# Patient Record
Sex: Male | Born: 1995 | Race: White | Hispanic: No | Marital: Married | Smoking: Never smoker
Health system: Southern US, Community
[De-identification: ages and names within clinical notes are randomized; demographics above are authoritative.]

---
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
## 2016-10-21 DIAGNOSIS — R112 Nausea with vomiting, unspecified: Secondary | ICD-10-CM | POA: Insufficient documentation

## 2016-10-21 DIAGNOSIS — E860 Dehydration: Secondary | ICD-10-CM | POA: Insufficient documentation

## 2016-10-21 DIAGNOSIS — R1084 Generalized abdominal pain: Secondary | ICD-10-CM | POA: Insufficient documentation

## 2016-10-22 ENCOUNTER — Encounter (HOSPITAL_COMMUNITY): Payer: Self-pay

## 2016-10-22 ENCOUNTER — Emergency Department (HOSPITAL_COMMUNITY)
Admission: EM | Admit: 2016-10-22 | Discharge: 2016-10-22 | Disposition: A | Discharge status: Home / Self Care | Payer: Medicaid Other | Attending: Emergency Medicine | Admitting: Emergency Medicine

## 2016-10-22 DIAGNOSIS — R112 Nausea with vomiting, unspecified: Secondary | ICD-10-CM

## 2016-10-22 DIAGNOSIS — R197 Diarrhea, unspecified: Secondary | ICD-10-CM

## 2016-10-22 DIAGNOSIS — E860 Dehydration: Secondary | ICD-10-CM

## 2016-10-22 LAB — URINALYSIS, ROUTINE W REFLEX MICROSCOPIC
BILIRUBIN URINE: NEGATIVE
GLUCOSE, UA: NEGATIVE mg/dL
HGB URINE DIPSTICK: NEGATIVE
Ketones, ur: 20 mg/dL — AB
LEUKOCYTES UA: NEGATIVE
NITRITE: NEGATIVE
PROTEIN: NEGATIVE mg/dL
SPECIFIC GRAVITY, URINE: 1.031 — AB (ref 1.005–1.030)
pH: 5 (ref 5.0–8.0)

## 2016-10-22 LAB — COMPREHENSIVE METABOLIC PANEL
ALK PHOS: 75 U/L (ref 38–126)
ALT: 35 U/L (ref 17–63)
ANION GAP: 12 (ref 5–15)
AST: 24 U/L (ref 15–41)
Albumin: 4.5 g/dL (ref 3.5–5.0)
BUN: 22 mg/dL — ABNORMAL HIGH (ref 6–20)
CALCIUM: 9.4 mg/dL (ref 8.9–10.3)
CO2: 23 mmol/L (ref 22–32)
Chloride: 100 mmol/L — ABNORMAL LOW (ref 101–111)
Creatinine, Ser: 1.04 mg/dL (ref 0.61–1.24)
Glucose, Bld: 126 mg/dL — ABNORMAL HIGH (ref 65–99)
Potassium: 3.6 mmol/L (ref 3.5–5.1)
Sodium: 135 mmol/L (ref 135–145)
Total Bilirubin: 1.5 mg/dL — ABNORMAL HIGH (ref 0.3–1.2)
Total Protein: 8 g/dL (ref 6.5–8.1)

## 2016-10-22 LAB — CBC
HEMATOCRIT: 49 % (ref 39.0–52.0)
HEMOGLOBIN: 17.3 g/dL — AB (ref 13.0–17.0)
MCH: 31.4 pg (ref 26.0–34.0)
MCHC: 35.3 g/dL (ref 30.0–36.0)
MCV: 88.9 fL (ref 78.0–100.0)
Platelets: 312 10*3/uL (ref 150–400)
RBC: 5.51 MIL/uL (ref 4.22–5.81)
RDW: 12 % (ref 11.5–15.5)
WBC: 12.4 10*3/uL — AB (ref 4.0–10.5)

## 2016-10-22 LAB — LIPASE, BLOOD: LIPASE: 19 U/L (ref 11–51)

## 2016-10-22 MED ORDER — SODIUM CHLORIDE 0.9 % IV BOLUS (SEPSIS)
1000.0000 mL | Freq: Once | INTRAVENOUS | Status: AC
  Administered 2016-10-22: 1000 mL via INTRAVENOUS

## 2016-10-22 MED ORDER — ONDANSETRON HCL 4 MG/2ML IJ SOLN
4.0000 mg | Freq: Once | INTRAMUSCULAR | Status: DC
  Filled 2016-10-22: qty 2

## 2016-10-22 MED ORDER — OXYCODONE-ACETAMINOPHEN 5-325 MG PO TABS
1.0000 | ORAL_TABLET | ORAL | Status: DC | PRN
  Filled 2016-10-22: qty 1

## 2016-10-22 MED ORDER — MAGNESIUM OXIDE 400 (241.3 MG) MG PO TABS
800.0000 mg | ORAL_TABLET | Freq: Once | ORAL | Status: AC
  Administered 2016-10-22: 800 mg via ORAL
  Filled 2016-10-22: qty 2

## 2016-10-22 MED ORDER — IBUPROFEN 200 MG PO TABS
400.0000 mg | ORAL_TABLET | Freq: Once | ORAL | Status: AC | PRN
  Administered 2016-10-22: 400 mg via ORAL
  Filled 2016-10-22: qty 2

## 2016-10-22 MED ORDER — ONDANSETRON 4 MG PO TBDP
4.0000 mg | ORAL_TABLET | Freq: Once | ORAL | Status: AC | PRN
  Administered 2016-10-22: 4 mg via ORAL
  Filled 2016-10-22: qty 1

## 2016-10-22 MED ORDER — ONDANSETRON 8 MG PO TBDP: 8.0000 mg | ORAL_TABLET | Freq: Three times a day (TID) | ORAL | 0 refills | Status: DC | PRN

## 2016-10-22 NOTE — ED Notes (Signed)
1x call back for lab draw, no answer

## 2016-10-22 NOTE — ED Notes (Signed)
He tells me he feels much better. He ambulates to b.r. And back without difficulty.

## 2016-10-22 NOTE — ED Notes (Signed)
Lab needs a recollect on the CMP

## 2016-10-22 NOTE — ED Notes (Signed)
Tried to go talk with family/patient about wait but left before I could get to them to talk they had left.

## 2016-10-22 NOTE — Discharge Instructions (Signed)
Hydrate well. Soft and clear liquid diet for now, and advance slowly to normal food.  Please return to the ER if your symptoms worsen; you have increased pain, fevers, chills, inability to keep any medications down, confusion or if you have bloody stools or vomiting.

## 2016-10-22 NOTE — ED Provider Notes (Signed)
[facility]-EMERGENCY DEPT Provider Note   CSN: [id number] Arrival date & time: 10/21/16  2248     History   Chief Complaint No chief complaint on file.   HPI Robert Solis is a 21 y.o. male.  HPI  (S) Robert Solis is a 22 y.o. male with complaint of gastrointestinal symptoms of watery diarrhea, lower abdominal cramps, nausea, vomiting, dehydration for 1 day. No blood in stool. No sick contacts. Pt denies any suspicious po intake. No blood in the stools or emesis.   No past medical history on file.  There are no active problems to display for this patient.   History reviewed. No pertinent surgical history.     Home Medications    Prior to Admission medications   Medication Sig Start Date End Date Taking? Authorizing Provider  ondansetron (ZOFRAN ODT) 8 MG disintegrating tablet Take 1 tablet (8 mg total) by mouth every 8 (eight) hours as needed for nausea. 10/22/16   Derwood Kaplan, MD    Family History No family history on file.  Social History Social History  Substance Use Topics  . Smoking status: Never Smoker  . Smokeless tobacco: Not on file  . Alcohol use No     Allergies   Patient has no known allergies.   Review of Systems Review of Systems  Constitutional: Positive for activity change.  Gastrointestinal: Positive for abdominal pain, diarrhea, nausea and vomiting.  Musculoskeletal: Positive for myalgias.  Neurological: Positive for dizziness.     Physical Exam Updated Vital Signs BP 111/74 (BP Location: Left Arm)   Pulse 70   Temp 98.2 F (36.8 C) (Oral)   Resp 13   Ht 5\' 7"  (1.702 m)   Wt 96.6 kg (213 lb)   SpO2 97%   BMI 33.36 kg/m   Physical Exam  Constitutional: He is oriented to person, place, and time. He appears well-developed.  HENT:  Head: Normocephalic and atraumatic.  Dry mucus membrane  Eyes: Conjunctivae and EOM are normal. Pupils are equal, round, and reactive to light.  Neck: Normal range of motion. Neck supple.    Cardiovascular: Normal rate and regular rhythm.   Pulmonary/Chest: Effort normal and breath sounds normal.  Abdominal: Soft. Bowel sounds are normal. He exhibits no distension and no mass. There is no tenderness. There is no rebound and no guarding.  Musculoskeletal: He exhibits no deformity.  Neurological: He is alert and oriented to person, place, and time.  Skin: Skin is warm.  Nursing note and vitals reviewed.    ED Treatments / Results  Labs (all labs ordered are listed, but only abnormal results are displayed) Labs Reviewed  CBC - Abnormal; Notable for the following:       Result Value   WBC 12.4 (*)    Hemoglobin 17.3 (*)    All other components within normal limits  URINALYSIS, ROUTINE W REFLEX MICROSCOPIC - Abnormal; Notable for the following:    Specific Gravity, Urine 1.031 (*)    Ketones, ur 20 (*)    All other components within normal limits  COMPREHENSIVE METABOLIC PANEL - Abnormal; Notable for the following:    Chloride 100 (*)    Glucose, Bld 126 (*)    BUN 22 (*)    Total Bilirubin 1.5 (*)    All other components within normal limits  LIPASE, BLOOD    EKG  EKG Interpretation None       Radiology No results found.  Procedures Procedures (including critical care time)  Medications Ordered  in ED Medications  ondansetron (ZOFRAN) injection 4 mg (4 mg Intravenous Not Given 10/22/16 0713)  ondansetron (ZOFRAN-ODT) disintegrating tablet 4 mg (4 mg Oral Given 10/22/16 0054)  ibuprofen (ADVIL,MOTRIN) tablet 400 mg (400 mg Oral Given 10/22/16 0053)  sodium chloride 0.9 % bolus 1,000 mL (0 mLs Intravenous Stopped 10/22/16 0659)  magnesium oxide (MAG-OX) tablet 800 mg (800 mg Oral Given 10/22/16 0711)     Initial Impression / Assessment and Plan / ED Course  I have reviewed the triage vital signs and the nursing notes.  Pertinent labs & imaging results that were available during my care of the patient were reviewed by me and considered in my medical  decision making (see chart for details).  Clinical Course as of Oct 23 831  Sun Oct 22, 2016  [id number] Pt reassessed. Pt's VSS and WNL. Pt's cap refill < 3 seconds. Pt has been hydrated in the ER and now passed po challenge. We will discharge with antiemetic. Strict ER return precautions have been discussed and pt will return if he is unable to tolerate fluids and symptoms are getting worse.   [AN]    Clinical Course User Index [AN] Derwood KaplanNanavati, Caralyn Twining, MD    (A) Gastroenteritis or food poisoning likely leading to dehydration  (P) I have recommended small amounts clear fluids frequently, soups, juices, water and advance diet as tolerated. Return office visit if symptoms persist or worsen; I have alerted the patient to call if high fever, dehydration, marked weakness, fainting, increased abdominal pain, blood in stool or vomit.   Final Clinical Impressions(s) / ED Diagnoses   Final diagnoses:  Dehydration  Nausea vomiting and diarrhea    New Prescriptions New Prescriptions   ONDANSETRON (ZOFRAN ODT) 8 MG DISINTEGRATING TABLET    Take 1 tablet (8 mg total) by mouth every 8 (eight) hours as needed for nausea.     Derwood KaplanNanavati, Vincy Feliz, MD 10/22/16 [phone]

## 2016-10-22 NOTE — ED Notes (Signed)
Pt c/o left abd pain since 10am this morning. Pt has vomited 6-8 times today. Also c/o diarrhea for same amount of time. Pt also C/o of SOB for the past hour.

## 2018-03-01 ENCOUNTER — Other Ambulatory Visit: Payer: Self-pay

## 2018-03-01 ENCOUNTER — Emergency Department (HOSPITAL_COMMUNITY): Payer: Medicaid Other

## 2018-03-01 ENCOUNTER — Encounter (HOSPITAL_COMMUNITY): Payer: Self-pay

## 2018-03-01 ENCOUNTER — Emergency Department (HOSPITAL_COMMUNITY)
Admission: EM | Admit: 2018-03-01 | Discharge: 2018-03-01 | Disposition: A | Discharge status: Home / Self Care | Payer: Medicaid Other | Attending: Emergency Medicine | Admitting: Emergency Medicine

## 2018-03-01 DIAGNOSIS — N23 Unspecified renal colic: Secondary | ICD-10-CM

## 2018-03-01 LAB — CBC WITH DIFFERENTIAL/PLATELET
Basophils Absolute: 0 10*3/uL (ref 0.0–0.1)
Basophils Relative: 0 %
Eosinophils Absolute: 0.2 10*3/uL (ref 0.0–0.7)
Eosinophils Relative: 3 %
HEMATOCRIT: 44.6 % (ref 39.0–52.0)
HEMOGLOBIN: 15.4 g/dL (ref 13.0–17.0)
LYMPHS ABS: 1.9 10*3/uL (ref 0.7–4.0)
LYMPHS PCT: 26 %
MCH: 31.2 pg (ref 26.0–34.0)
MCHC: 34.5 g/dL (ref 30.0–36.0)
MCV: 90.3 fL (ref 78.0–100.0)
MONOS PCT: 14 %
Monocytes Absolute: 1 10*3/uL (ref 0.1–1.0)
NEUTROS ABS: 4.2 10*3/uL (ref 1.7–7.7)
NEUTROS PCT: 57 %
Platelets: 272 10*3/uL (ref 150–400)
RBC: 4.94 MIL/uL (ref 4.22–5.81)
RDW: 12.1 % (ref 11.5–15.5)
WBC: 7.3 10*3/uL (ref 4.0–10.5)

## 2018-03-01 LAB — BASIC METABOLIC PANEL
Anion gap: 10 (ref 5–15)
BUN: 21 mg/dL — AB (ref 6–20)
CO2: 26 mmol/L (ref 22–32)
Calcium: 9.6 mg/dL (ref 8.9–10.3)
Chloride: 104 mmol/L (ref 98–111)
Creatinine, Ser: 1.03 mg/dL (ref 0.61–1.24)
GFR calc non Af Amer: >60 mL/min (ref >60)
Glucose, Bld: 103 mg/dL — ABNORMAL HIGH (ref 70–99)
POTASSIUM: 3.3 mmol/L — AB (ref 3.5–5.1)
SODIUM: 140 mmol/L (ref 135–145)

## 2018-03-01 LAB — RAPID URINE DRUG SCREEN, HOSP PERFORMED
AMPHETAMINES: NOT DETECTED
Barbiturates: NOT DETECTED
Benzodiazepines: NOT DETECTED
Cocaine: NOT DETECTED
OPIATES: NOT DETECTED
Tetrahydrocannabinol: POSITIVE — AB

## 2018-03-01 LAB — URINALYSIS, ROUTINE W REFLEX MICROSCOPIC
Bacteria, UA: NONE SEEN
Bilirubin Urine: NEGATIVE
GLUCOSE, UA: NEGATIVE mg/dL
KETONES UR: NEGATIVE mg/dL
LEUKOCYTES UA: NEGATIVE
NITRITE: NEGATIVE
PROTEIN: NEGATIVE mg/dL
Specific Gravity, Urine: 1.028 (ref 1.005–1.030)
pH: 5 (ref 5.0–8.0)

## 2018-03-01 LAB — CK: CK TOTAL: 350 U/L (ref 49–397)

## 2018-03-01 MED ORDER — MORPHINE SULFATE (PF) 4 MG/ML IV SOLN
4.0000 mg | Freq: Once | INTRAVENOUS | Status: AC
  Administered 2018-03-01: 4 mg via INTRAVENOUS
  Filled 2018-03-01: qty 1

## 2018-03-01 MED ORDER — ONDANSETRON 4 MG PO TBDP: 4.0000 mg | ORAL_TABLET | Freq: Three times a day (TID) | ORAL | 0 refills | Status: AC | PRN

## 2018-03-01 MED ORDER — OXYCODONE-ACETAMINOPHEN 5-325 MG PO TABS
2.0000 | ORAL_TABLET | Freq: Once | ORAL | Status: AC
  Administered 2018-03-01: 2 via ORAL
  Filled 2018-03-01: qty 2

## 2018-03-01 MED ORDER — KETOROLAC TROMETHAMINE 30 MG/ML IJ SOLN
30.0000 mg | Freq: Once | INTRAMUSCULAR | Status: AC
  Administered 2018-03-01: 30 mg via INTRAVENOUS
  Filled 2018-03-01: qty 1

## 2018-03-01 MED ORDER — SODIUM CHLORIDE 0.9 % IV BOLUS
1000.0000 mL | Freq: Once | INTRAVENOUS | Status: AC
  Administered 2018-03-01: 1000 mL via INTRAVENOUS

## 2018-03-01 MED ORDER — OXYCODONE-ACETAMINOPHEN 5-325 MG PO TABS: 1.0000 | ORAL_TABLET | ORAL | 0 refills | Status: AC | PRN

## 2018-03-01 MED ORDER — ONDANSETRON HCL 4 MG/2ML IJ SOLN
4.0000 mg | Freq: Once | INTRAMUSCULAR | Status: AC
  Administered 2018-03-01: 4 mg via INTRAVENOUS
  Filled 2018-03-01: qty 2

## 2018-03-01 MED ORDER — IBUPROFEN 600 MG PO TABS: 600.0000 mg | ORAL_TABLET | Freq: Four times a day (QID) | ORAL | 0 refills | Status: AC | PRN

## 2018-03-01 NOTE — Discharge Instructions (Addendum)
Take the pain and nausea medication as prescribed.  Do not drive or operate heavy machinery when you are taking the pain medication.  Follow-up with the urologist.  Return to the ED if you develop worsening pain, vomiting, fever or any other concerns.

## 2018-03-01 NOTE — ED Triage Notes (Signed)
Pt reports left lower back pain onset tonight, states has vomited x 1 and not sure if it is related to the back pain.  Pt denies urinary symptoms.

## 2018-03-01 NOTE — ED Provider Notes (Signed)
[facility] EMERGENCY DEPARTMENT Provider Note   CSN: [id number] Arrival date & time: 03/01/18  0408     History   Chief Complaint Chief Complaint  Patient presents with  . Back Pain    HPI Robert Solis is a 22 y.o. male.  Patient awoke from sleep about 3 AM with left-sided flank and low back pain.  He felt well when he went to bed.  States he worked out in the [employer] yesterday but does state adequately hydrated.  He describes constant left-sided pain that is not improved with naproxen.  He has never had this kind of pain before.  No fall or injury.  No radiation of the pain down his leg.  Did have one episode of vomiting.  No pain with urination or blood in the urine.  No bowel or bladder incontinence.  No fever.  No history of previous back issues.  Denies any IV drug abuse or history of cancer.  He has never had a kidney stone and no previous abdominal surgeries.  The history is provided by the patient and the EMS personnel.  Back Pain   Pertinent negatives include no chest pain, no fever, no headaches, no dysuria and no weakness.    History reviewed. No pertinent past medical history.  There are no active problems to display for this patient.   History reviewed. No pertinent surgical history.      Home Medications    Prior to Admission medications   Medication Sig Start Date End Date Taking? Authorizing Provider  ondansetron (ZOFRAN ODT) 8 MG disintegrating tablet Take 1 tablet (8 mg total) by mouth every 8 (eight) hours as needed for nausea. 10/22/16   Derwood Kaplan, MD    Family History No family history on file.  Social History Social History   Tobacco Use  . Smoking status: Never Smoker  . Smokeless tobacco: Never Used  Substance Use Topics  . Alcohol use: No  . Drug use: Never     Allergies   Patient has no known allergies.   Review of Systems Review of Systems  Constitutional: Negative for activity change, appetite change and fever.  HENT:  Negative for congestion, rhinorrhea and sinus pressure.   Eyes: Negative for visual disturbance.  Respiratory: Negative for cough, chest tightness and shortness of breath.   Cardiovascular: Negative for chest pain.  Gastrointestinal: Positive for nausea and vomiting.  Genitourinary: Negative for dysuria, hematuria and testicular pain.  Musculoskeletal: Positive for back pain. Negative for arthralgias, myalgias and neck pain.  Skin: Negative for rash.  Neurological: Negative for dizziness, weakness and headaches.   all other systems are negative except as noted in the HPI and PMH.     Physical Exam Updated Vital Signs BP (!) 130/99 (BP Location: Left Arm)   Pulse (!) 53   Temp 98.1 F (36.7 C) (Oral)   Resp 18   Ht 5\' 9"  (1.753 m)   Wt 81.6 kg   SpO2 100%   BMI 26.58 kg/m   Physical Exam  Constitutional: He is oriented to person, place, and time. He appears well-developed and well-nourished. He appears distressed.  Appears anxious and uncomfortable  HENT:  Head: Normocephalic and atraumatic.  Mouth/Throat: Oropharynx is clear and moist. No oropharyngeal exudate.  Eyes: Pupils are equal, round, and reactive to light. Conjunctivae and EOM are normal.  Neck: Normal range of motion. Neck supple.  No meningismus.  Cardiovascular: Normal rate, regular rhythm, normal heart sounds and intact distal pulses.  No  murmur heard. Pulmonary/Chest: Effort normal and breath sounds normal. No respiratory distress.  Abdominal: Soft. There is no tenderness. There is no rebound and no guarding.  Genitourinary:  Genitourinary Comments: No testicular tenderness  Musculoskeletal: Normal range of motion. He exhibits no edema or tenderness.  L CVAT  5/5 strength in bilateral lower extremities. Ankle plantar and dorsiflexion intact. Great toe extension intact bilaterally. +2 DP and PT pulses. +2 patellar reflexes bilaterally. Normal gait.   Neurological: He is alert and oriented to person, place,  and time. No cranial nerve deficit. He exhibits normal muscle tone. Coordination normal.  No ataxia on finger to nose bilaterally. No pronator drift. 5/5 strength throughout. CN 2-12 intact.Equal grip strength. Sensation intact.   Skin: Skin is warm.  Psychiatric: He has a normal mood and affect. His behavior is normal.  Nursing note and vitals reviewed.    ED Treatments / Results  Labs (all labs ordered are listed, but only abnormal results are displayed) Labs Reviewed  URINALYSIS, ROUTINE W REFLEX MICROSCOPIC - Abnormal; Notable for the following components:      Result Value   APPearance HAZY (*)    Hgb urine dipstick LARGE (*)    All other components within normal limits  RAPID URINE DRUG SCREEN, HOSP PERFORMED - Abnormal; Notable for the following components:   Tetrahydrocannabinol POSITIVE (*)    All other components within normal limits  BASIC METABOLIC PANEL - Abnormal; Notable for the following components:   Potassium 3.3 (*)    Glucose, Bld 103 (*)    BUN 21 (*)    All other components within normal limits  CBC WITH DIFFERENTIAL/PLATELET  CK    EKG None  Radiology Ct Renal Stone Study  Result Date: 03/01/2018 CLINICAL DATA:  Left flank pain.  Vomiting. EXAM: CT ABDOMEN AND PELVIS WITHOUT CONTRAST TECHNIQUE: Multidetector CT imaging of the abdomen and pelvis was performed following the standard protocol without IV contrast. COMPARISON:  None. FINDINGS: Lower chest: The lung bases are clear. Hepatobiliary: No focal liver abnormality is seen. No gallstones, gallbladder wall thickening, or biliary dilatation. Pancreas: No ductal dilatation or inflammation. Spleen: Normal in size without focal abnormality. Adrenals/Urinary Tract: Normal adrenal glands. Punctate stone at the left ureteral pelvic junction with mild hydronephrosis. No significant perinephric edema. No right renal stone or hydronephrosis. Both ureters are decompressed. Urinary bladder is nondistended.  Stomach/Bowel: Stomach is within normal limits. Appendix appears normal. No evidence of bowel wall thickening, distention, or inflammatory changes. Vascular/Lymphatic: Normal caliber abdominopelvic vasculature. No adenopathy. Reproductive: Prostate is unremarkable. Other: Tiny fat containing umbilical hernia. Bilateral pelvic phleboliths. No free air, free fluid, or intra-abdominal fluid collection. Musculoskeletal: There are no acute or suspicious osseous abnormalities. IMPRESSION: Punctate stone at the left ureteropelvic junction with mild hydronephrosis. Electronically Signed   By: Narda Rutherford M.D.   On: 03/01/2018 05:32    Procedures Procedures (including critical care time)  Medications Ordered in ED Medications  sodium chloride 0.9 % bolus 1,000 mL (has no administration in time range)  ondansetron (ZOFRAN) injection 4 mg (has no administration in time range)  morphine 4 MG/ML injection 4 mg (has no administration in time range)  ketorolac (TORADOL) 30 MG/ML injection 30 mg (has no administration in time range)     Initial Impression / Assessment and Plan / ED Course  I have reviewed the triage vital signs and the nursing notes.  Pertinent labs & imaging results that were available during my care of the patient were reviewed by me  and considered in my medical decision making (see chart for details).    Acute onset of left-sided flank pain with nausea and vomiting.  No urinary symptoms.  No fever.  Neurologically intact.  Concern for kidney stone. Low suspicion for cord compression or cauda equina or epidural abscess.  UA with hematuria, no infection. Cr and CK normal. Hydration, symptom control.  Imaging confirms UPJ stone. Pain and nausea have improved.  Patient tolerating p.o.  No evidence of infection.  Patient's pain and nausea are controlled.  Will discharge with symptomatic treatment and follow-up with urology.  Return precautions discussed including worsening pain,  vomiting or fever. Final Clinical Impressions(s) / ED Diagnoses   Final diagnoses:  Ureteral colic    ED Discharge Orders    None       Sherlene Rickel, Jeannett Senior, MD 03/01/18 [phone]

## 2020-02-20 IMAGING — CT CT RENAL STONE PROTOCOL
2 of 4 series · 16 of 46 positions shown, 18 images · non-contrast
Comparison: None.

CLINICAL DATA: Left flank pain.  Vomiting.

EXAM:
CT ABDOMEN AND PELVIS WITHOUT CONTRAST
TECHNIQUE: Multidetector CT imaging of the abdomen and pelvis was performed
following the standard protocol without IV contrast.

[Series 2: axial st · axial · 0.74mm/px · z∈[+879,+1304]mm · 13 of 93 slices shown, 15 images]
[im 4/93  soft-tissue]
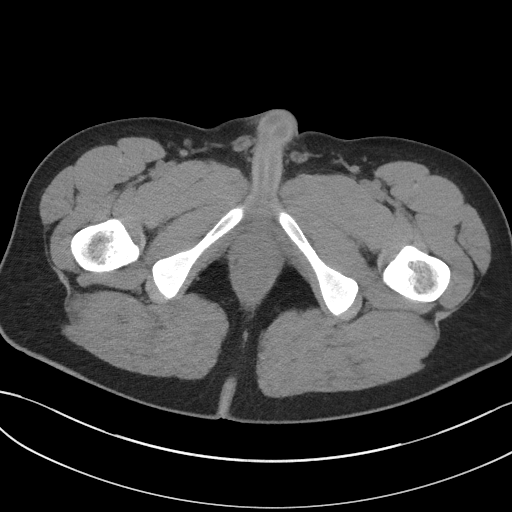
[im 4/93  bone]
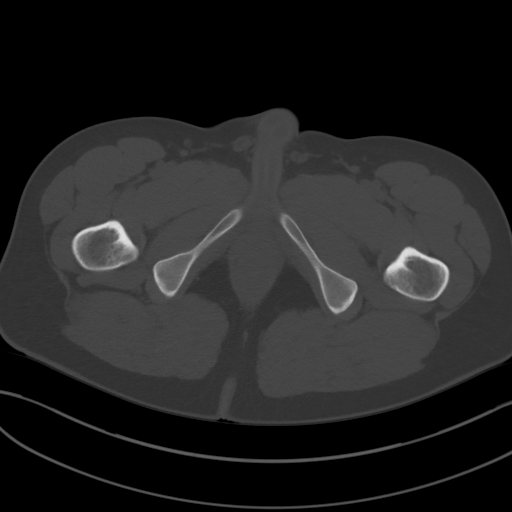
[im 12/93  soft-tissue]
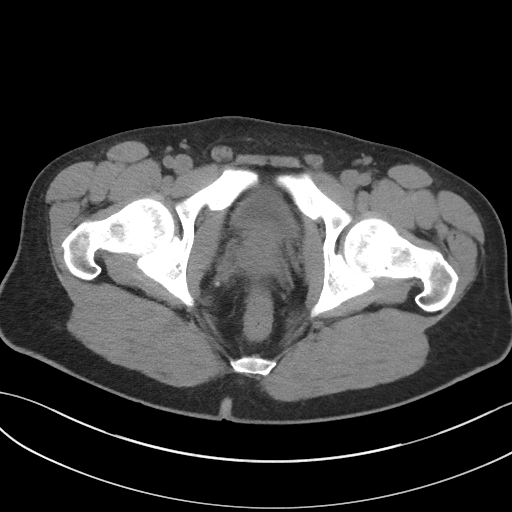
[im 20/93  soft-tissue]
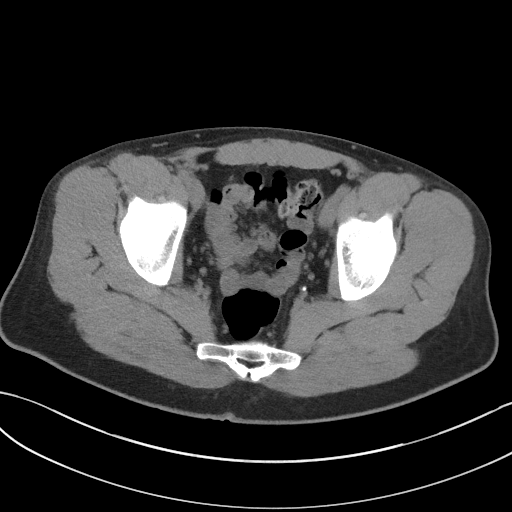
[im 27/93  soft-tissue]
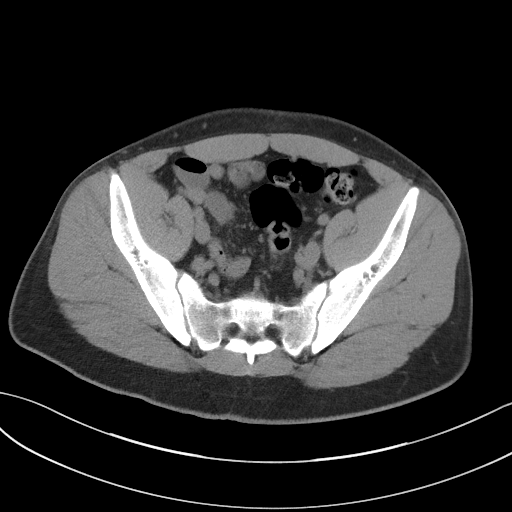
[im 31/93  soft-tissue]
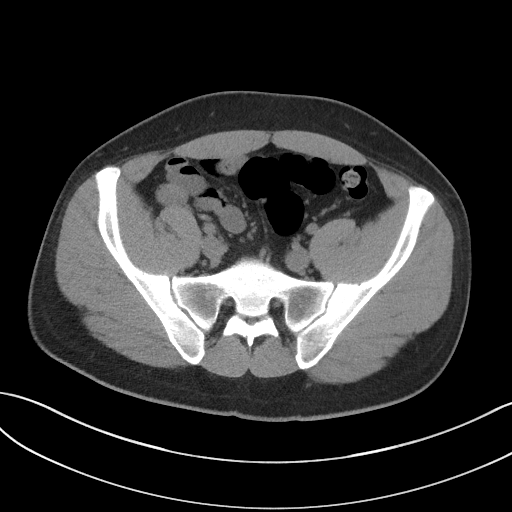
[im 39/93  soft-tissue]
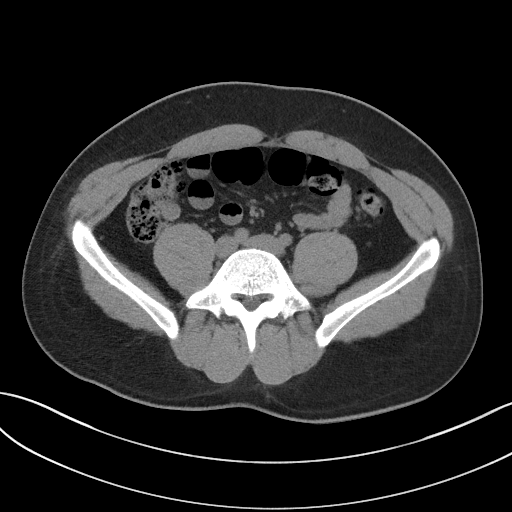
[im 47/93  soft-tissue]
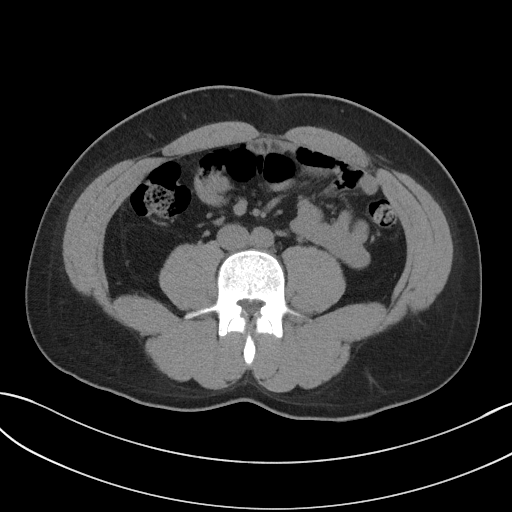
[im 54/93  soft-tissue]
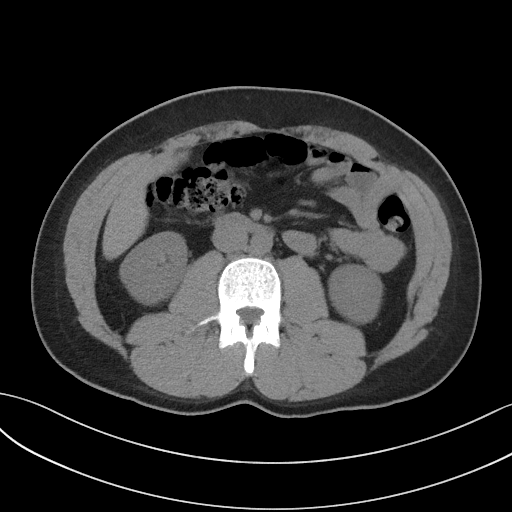
[im 62/93  soft-tissue]
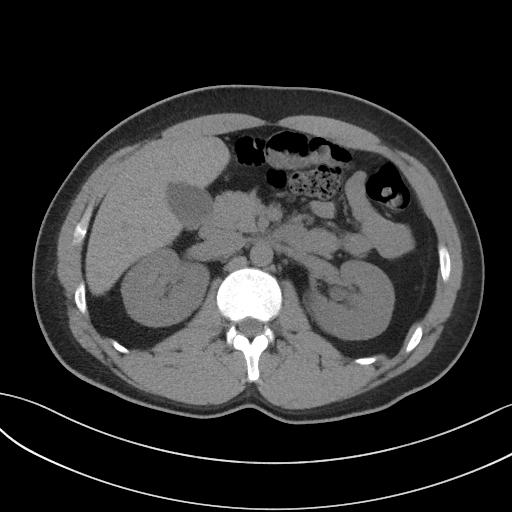
[im 62/93  bone]
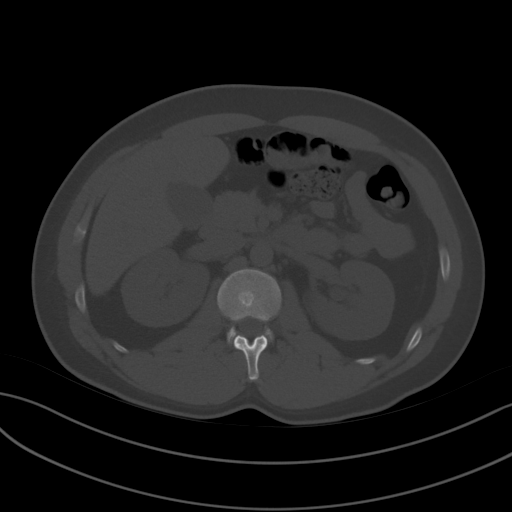
[im 66/93  soft-tissue]
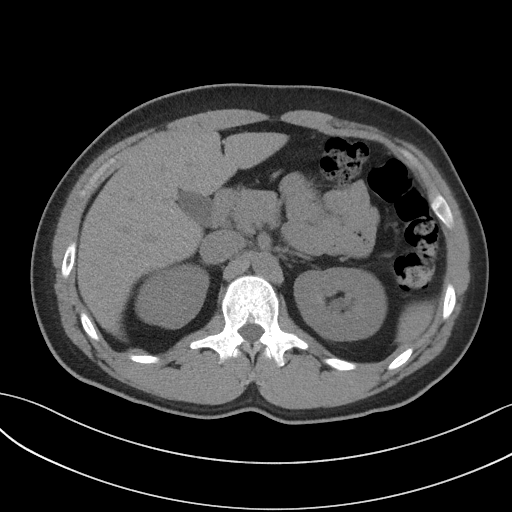
[im 73/93  soft-tissue]
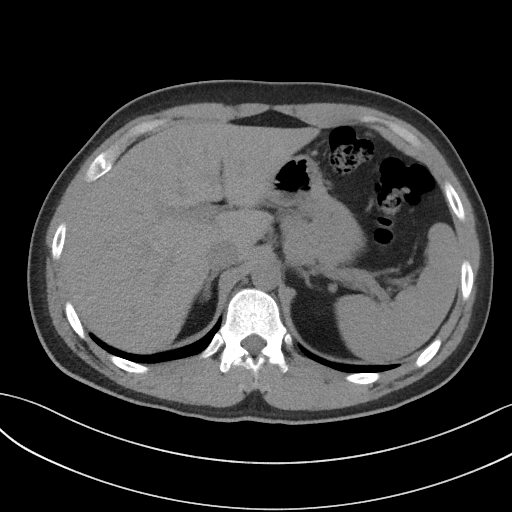
[im 81/93  soft-tissue]
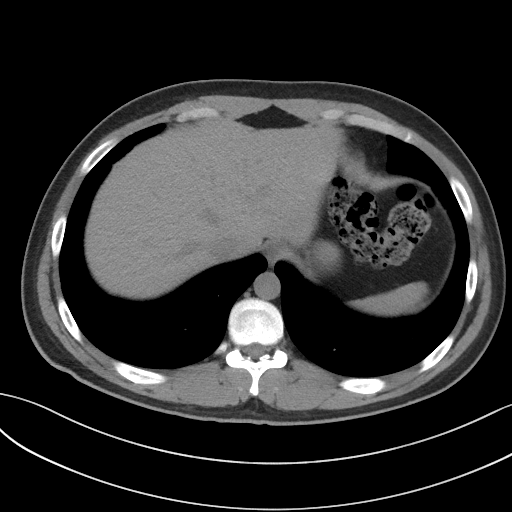
[im 89/93  soft-tissue]
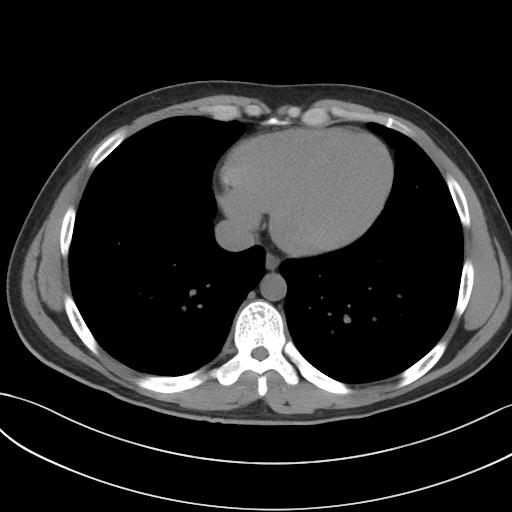

[Series 5: coronal st · coronal · 0.70mm/px · 3 of 83 slices shown]
[im 28/83  soft-tissue]
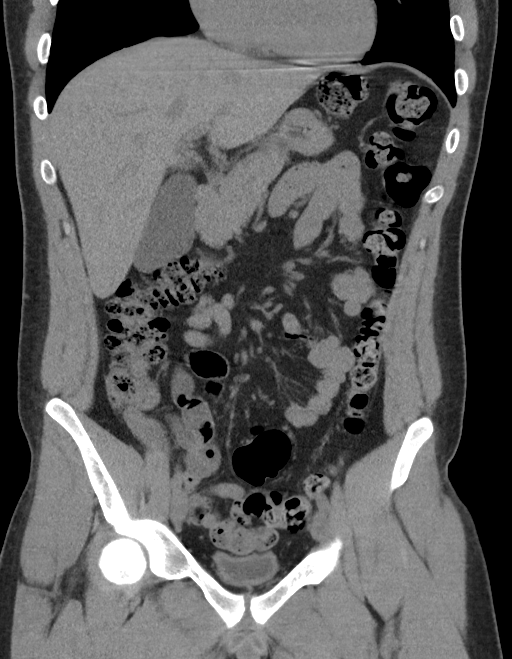
[im 37/83  soft-tissue]
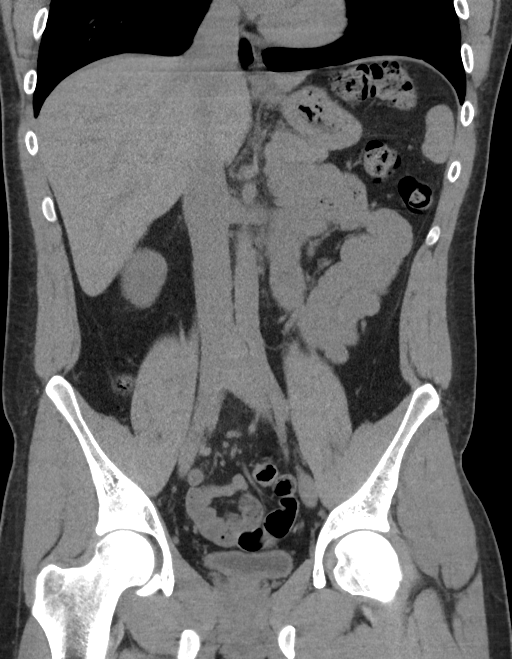
[im 46/83  soft-tissue]
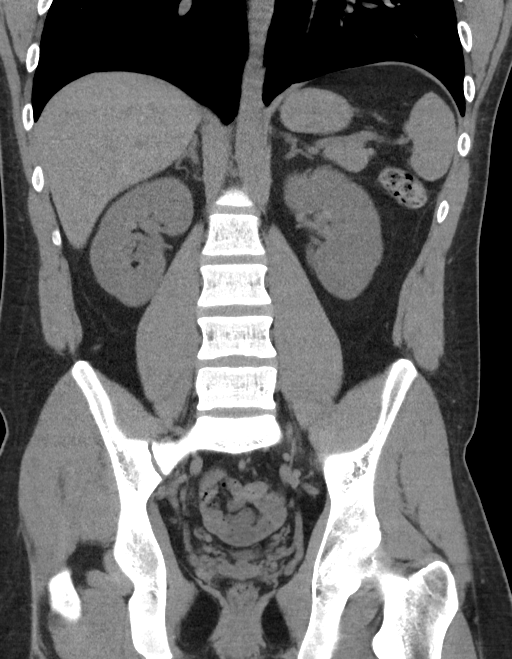

[16 of 46 positions shown; findings below may reference images not displayed]

FINDINGS: Lower chest: The lung bases are clear.

Hepatobiliary: No focal liver abnormality is seen. No gallstones,
gallbladder wall thickening, or biliary dilatation.

Pancreas: No ductal dilatation or inflammation.

Spleen: Normal in size without focal abnormality.

Adrenals/Urinary Tract: Normal adrenal glands. Punctate stone at the
left ureteral pelvic junction with mild hydronephrosis. No
significant perinephric edema. No right renal stone or
hydronephrosis. Both ureters are decompressed. Urinary bladder is
nondistended.

Stomach/Bowel: Stomach is within normal limits. Appendix appears
normal. No evidence of bowel wall thickening, distention, or
inflammatory changes.

Vascular/Lymphatic: Normal caliber abdominopelvic vasculature. No
adenopathy.

Reproductive: Prostate is unremarkable.

Other: Tiny fat containing umbilical hernia. Bilateral pelvic
phleboliths. No free air, free fluid, or intra-abdominal fluid
collection.

Musculoskeletal: There are no acute or suspicious osseous
abnormalities.
IMPRESSION: Punctate stone at the left ureteropelvic junction with mild
hydronephrosis.

## 2021-07-04 ENCOUNTER — Ambulatory Visit
Admission: EM | Admit: 2021-07-04 | Discharge: 2021-07-04 | Disposition: A | Discharge status: Home / Self Care | Payer: Medicaid Other | Attending: Family Medicine | Admitting: Family Medicine

## 2021-07-04 ENCOUNTER — Other Ambulatory Visit: Payer: Self-pay

## 2021-07-04 DIAGNOSIS — J069 Acute upper respiratory infection, unspecified: Secondary | ICD-10-CM

## 2021-07-04 DIAGNOSIS — H66002 Acute suppurative otitis media without spontaneous rupture of ear drum, left ear: Secondary | ICD-10-CM

## 2021-07-04 MED ORDER — AMOXICILLIN 875 MG PO TABS: 875.0000 mg | ORAL_TABLET | Freq: Two times a day (BID) | ORAL | 0 refills | Status: DC

## 2021-07-04 NOTE — ED Provider Notes (Signed)
[facility]    CSN: [id number] Arrival date & time: 07/04/21  [id number]      History   Chief Complaint Chief Complaint  Patient presents with   Cough   Nasal Congestion         HPI Robert Solis is a 26 y.o. male.   Presenting today with 1 day history of cough, runny nose, sinus pressure, progressively worsening left ear pain, muffled hearing.  Denies fever, chills, body aches, chest pain, shortness of breath, abdominal pain, nausea vomiting or diarrhea.  Multiple sick contacts in the family but unsure with what.  So far not trying anything than Tylenol which is not providing much relief.   History reviewed. No pertinent past medical history.  There are no problems to display for this patient.   History reviewed. No pertinent surgical history.     Home Medications    Prior to Admission medications   Medication Sig Start Date End Date Taking? Authorizing Provider  amoxicillin (AMOXIL) 875 MG tablet Take 1 tablet (875 mg total) by mouth 2 (two) times daily. 07/04/21  Yes Volney American, PA-C  ibuprofen (ADVIL,MOTRIN) 600 MG tablet Take 1 tablet (600 mg total) by mouth every 6 (six) hours as needed. 03/01/18   Rancour, Annie Main, MD  ondansetron (ZOFRAN ODT) 4 MG disintegrating tablet Take 1 tablet (4 mg total) by mouth every 8 (eight) hours as needed for nausea or vomiting. 03/01/18   Rancour, Annie Main, MD  oxyCODONE-acetaminophen (PERCOCET/ROXICET) 5-325 MG tablet Take 1 tablet by mouth every 4 (four) hours as needed for severe pain. 03/01/18   Ezequiel Essex, MD    Family History Family History  Problem Relation Age of Onset   Healthy Mother    Healthy Father     Social History Social History   Tobacco Use   Smoking status: Never   Smokeless tobacco: Never  Substance Use Topics   Alcohol use: No   Drug use: Never     Allergies   Patient has no known allergies.   Review of Systems Review of Systems Per HPI  Physical Exam Triage Vital  Signs ED Triage Vitals  Enc Vitals Group     BP 07/04/21 0834 (!) 159/99     Pulse Rate 07/04/21 0834 97     Resp 07/04/21 0834 18     Temp 07/04/21 0834 97.7 F (36.5 C)     Temp Source 07/04/21 0834 Oral     SpO2 07/04/21 0834 98 %     Weight --      Height --      Head Circumference --      Peak Flow --      Pain Score 07/04/21 0835 0     Pain Loc --      Pain Edu? --      Excl. in [place]? --    No data found.  Updated Vital Signs BP (!) 159/99 (BP Location: Right Arm)    Pulse 97    Temp 97.7 F (36.5 C) (Oral)    Resp 18    SpO2 98%   Visual Acuity Right Eye Distance:   Left Eye Distance:   Bilateral Distance:    Right Eye Near:   Left Eye Near:    Bilateral Near:     Physical Exam Vitals and nursing note reviewed.  Constitutional:      Appearance: Normal appearance.  HENT:     Head: Atraumatic.     Ears:  Comments: Mild right middle ear effusion, left TM bulging, erythematous, edematous    Nose: Rhinorrhea present.     Mouth/Throat:     Mouth: Mucous membranes are moist.     Pharynx: Oropharynx is clear. Posterior oropharyngeal erythema present.  Eyes:     Extraocular Movements: Extraocular movements intact.     Conjunctiva/sclera: Conjunctivae normal.  Cardiovascular:     Rate and Rhythm: Normal rate and regular rhythm.  Pulmonary:     Effort: Pulmonary effort is normal.     Breath sounds: Normal breath sounds. No wheezing or rales.  Musculoskeletal:        General: Normal range of motion.     Cervical back: Normal range of motion and neck supple.  Skin:    General: Skin is warm and dry.  Neurological:     General: No focal deficit present.     Mental Status: He is oriented to person, place, and time.  Psychiatric:        Mood and Affect: Mood normal.        Thought Content: Thought content normal.        Judgment: Judgment normal.     UC Treatments / Results  Labs (all labs ordered are listed, but only abnormal results are displayed) Labs  Reviewed - No data to display  EKG   Radiology No results found.  Procedures Procedures (including critical care time)  Medications Ordered in UC Medications - No data to display  Initial Impression / Assessment and Plan / UC Course  I have reviewed the triage vital signs and the nursing notes.  Pertinent labs & imaging results that were available during my care of the patient were reviewed by me and considered in my medical decision making (see chart for details).     Suspect viral upper respiratory infection now with secondary left ear infection.  Treat with amoxicillin, DayQuil, Flonase, over-the-counter pain relievers.  Work note given.  Return for acutely worsening symptoms.  Declines viral testing today.  Final Clinical Impressions(s) / UC Diagnoses   Final diagnoses:  Viral URI with cough  Non-recurrent acute suppurative otitis media of left ear without spontaneous rupture of tympanic membrane   Discharge Instructions   None    ED Prescriptions     Medication Sig Dispense Auth. Provider   amoxicillin (AMOXIL) 875 MG tablet Take 1 tablet (875 mg total) by mouth 2 (two) times daily. 20 tablet Volney American, [place]      PDMP not reviewed this encounter.   Merrie Roof [place], [place] 07/04/21 [phone]

## 2021-07-04 NOTE — ED Triage Notes (Signed)
Pt reports cough, nasal congestion and clogged ears x 1 day.

## 2021-07-15 ENCOUNTER — Other Ambulatory Visit: Payer: Self-pay

## 2021-07-15 ENCOUNTER — Ambulatory Visit
Admission: EM | Admit: 2021-07-15 | Discharge: 2021-07-15 | Disposition: A | Discharge status: Home / Self Care | Payer: Self-pay | Attending: Urgent Care | Admitting: Urgent Care

## 2021-07-15 ENCOUNTER — Encounter: Payer: Self-pay | Admitting: Emergency Medicine

## 2021-07-15 DIAGNOSIS — H9202 Otalgia, left ear: Secondary | ICD-10-CM

## 2021-07-15 DIAGNOSIS — R030 Elevated blood-pressure reading, without diagnosis of hypertension: Secondary | ICD-10-CM

## 2021-07-15 DIAGNOSIS — H65192 Other acute nonsuppurative otitis media, left ear: Secondary | ICD-10-CM

## 2021-07-15 DIAGNOSIS — H938X2 Other specified disorders of left ear: Secondary | ICD-10-CM

## 2021-07-15 MED ORDER — CEFDINIR 300 MG PO CAPS: 300.0000 mg | ORAL_CAPSULE | Freq: Two times a day (BID) | ORAL | 0 refills | Status: AC

## 2021-07-15 MED ORDER — PSEUDOEPHEDRINE HCL 30 MG PO TABS: 30.0000 mg | ORAL_TABLET | Freq: Three times a day (TID) | ORAL | 0 refills | Status: AC | PRN

## 2021-07-15 MED ORDER — LEVOCETIRIZINE DIHYDROCHLORIDE 5 MG PO TABS: 5.0000 mg | ORAL_TABLET | Freq: Every evening | ORAL | 0 refills | Status: AC

## 2021-07-15 NOTE — ED Provider Notes (Signed)
[facility]   MRN: [id number] DOB: 10/31/1995  Subjective:   Robert Solis is a 26 y.o. male presenting for ongoing left ear pain.  Symptoms have persisted for 2 weeks.  He was seen already and started on amoxicillin which helped his sinuses tremendously but has not made much of a difference with his left ear pain.  No drainage, fevers.  No history of ear issues.  No current facility-administered medications for this encounter.  Current Outpatient Medications:    amoxicillin (AMOXIL) 875 MG tablet, Take 1 tablet (875 mg total) by mouth 2 (two) times daily., Disp: 20 tablet, Rfl: 0   ibuprofen (ADVIL,MOTRIN) 600 MG tablet, Take 1 tablet (600 mg total) by mouth every 6 (six) hours as needed., Disp: 30 tablet, Rfl: 0   ondansetron (ZOFRAN ODT) 4 MG disintegrating tablet, Take 1 tablet (4 mg total) by mouth every 8 (eight) hours as needed for nausea or vomiting., Disp: 20 tablet, Rfl: 0   oxyCODONE-acetaminophen (PERCOCET/ROXICET) 5-325 MG tablet, Take 1 tablet by mouth every 4 (four) hours as needed for severe pain., Disp: 15 tablet, Rfl: 0   No Known Allergies  History reviewed. No pertinent past medical history.   History reviewed. No pertinent surgical history.  Family History  Problem Relation Age of Onset   Healthy Mother    Healthy Father     Social History   Tobacco Use   Smoking status: Never   Smokeless tobacco: Never  Substance Use Topics   Alcohol use: No   Drug use: Never    ROS   Objective:   Vitals: BP (!) 157/93 (BP Location: Right Arm)    Pulse 92    Temp 98.1 F (36.7 C) (Oral)    Resp 18    Ht 5\' 9"  (1.753 m)    Wt 225 lb (102.1 kg)    SpO2 96%    BMI 33.23 kg/m   BP Readings from Last 3 Encounters:  07/15/21 (!) 157/93  07/04/21 (!) 159/99  03/01/18 119/81   Physical Exam Constitutional:      General: He is not in acute distress.    Appearance: Normal appearance. He is normal weight. He is not ill-appearing,  toxic-appearing or diaphoretic.  HENT:     Head: Normocephalic and atraumatic.     Right Ear: Tympanic membrane, ear canal and external ear normal. There is no impacted cerumen.     Left Ear: Ear canal and external ear normal. There is no impacted cerumen.     Ears:     Comments: Left TM bulging, erythematous.  It is intact without perforations.    Nose: Nose normal. No congestion or rhinorrhea.     Mouth/Throat:     Mouth: Mucous membranes are moist.     Pharynx: No oropharyngeal exudate or posterior oropharyngeal erythema.  Eyes:     General: No scleral icterus.       Right eye: No discharge.        Left eye: No discharge.     Extraocular Movements: Extraocular movements intact.     Conjunctiva/sclera: Conjunctivae normal.  Cardiovascular:     Rate and Rhythm: Normal rate.  Pulmonary:     Effort: Pulmonary effort is normal.  Musculoskeletal:     Cervical back: Normal range of motion and neck supple. No rigidity. No muscular tenderness.  Neurological:     General: No focal deficit present.     Mental Status: He is alert and oriented to person, place, and time.  Psychiatric:        Mood and Affect: Mood normal.        Behavior: Behavior normal.    Assessment and Plan :   PDMP not reviewed this encounter.  1. Other non-recurrent acute nonsuppurative otitis media of left ear   2. Left ear pain   3. Ear pressure, left    Start cefdinir to cover for persistent otitis media as he just completed amoxicillin. Use Xyzal and pseudoephedrine, follow-up with ENT.  Monitor blood pressure and recheck with [place] if it still elevated.  Today, suspect that it is elevated due to his pain.  Counseled patient on potential for adverse effects with medications prescribed/recommended today, ER and return-to-clinic precautions discussed, patient verbalized understanding.    Wallis Bamberg, PA-C 07/15/21 1227

## 2021-07-15 NOTE — ED Triage Notes (Signed)
Pt seen for same x2 weeks ago and reports was px abx and reports took all of medication but reports continued left ear pain.

## 2021-11-17 ENCOUNTER — Other Ambulatory Visit: Payer: Self-pay

## 2021-11-17 ENCOUNTER — Encounter: Payer: Self-pay | Admitting: Emergency Medicine

## 2021-11-17 ENCOUNTER — Ambulatory Visit
Admission: EM | Admit: 2021-11-17 | Discharge: 2021-11-17 | Disposition: A | Discharge status: Home / Self Care | Payer: Medicaid Other | Attending: Family Medicine | Admitting: Family Medicine

## 2021-11-17 DIAGNOSIS — H66001 Acute suppurative otitis media without spontaneous rupture of ear drum, right ear: Secondary | ICD-10-CM

## 2021-11-17 MED ORDER — AMOXICILLIN 875 MG PO TABS: 875.0000 mg | ORAL_TABLET | Freq: Two times a day (BID) | ORAL | 0 refills | Status: AC

## 2021-11-17 NOTE — ED Provider Notes (Signed)
[facility]    CSN: [id number] Arrival date & time: 11/17/21  1910      History   Chief Complaint Chief Complaint  Patient presents with   Ear Pain    HPI Robert Solis is a 26 y.o. male.   Presenting today with several hour history of sudden onset sharp stabbing right ear pain.  Denies recent illness, congestion, cough, headache, drainage from the ear, loss of hearing.  States he has had similar pain before and it ended up being an ear infection.  So far not tried anything over-the-counter for symptoms.    History reviewed. No pertinent past medical history.  There are no problems to display for this patient.   History reviewed. No pertinent surgical history.     Home Medications    Prior to Admission medications   Medication Sig Start Date End Date Taking? Authorizing Provider  amoxicillin (AMOXIL) 875 MG tablet Take 1 tablet (875 mg total) by mouth 2 (two) times daily. 11/17/21   Particia Nearing, PA-C  cefdinir (OMNICEF) 300 MG capsule Take 1 capsule (300 mg total) by mouth 2 (two) times daily. 07/15/21   Wallis Bamberg, PA-C  ibuprofen (ADVIL,MOTRIN) 600 MG tablet Take 1 tablet (600 mg total) by mouth every 6 (six) hours as needed. 03/01/18   Rancour, Jeannett Senior, MD  levocetirizine (XYZAL) 5 MG tablet Take 1 tablet (5 mg total) by mouth every evening. 07/15/21   Wallis Bamberg, PA-C  ondansetron (ZOFRAN ODT) 4 MG disintegrating tablet Take 1 tablet (4 mg total) by mouth every 8 (eight) hours as needed for nausea or vomiting. 03/01/18   Rancour, Jeannett Senior, MD  oxyCODONE-acetaminophen (PERCOCET/ROXICET) 5-325 MG tablet Take 1 tablet by mouth every 4 (four) hours as needed for severe pain. 03/01/18   Rancour, Jeannett Senior, MD  pseudoephedrine (SUDAFED) 30 MG tablet Take 1 tablet (30 mg total) by mouth every 8 (eight) hours as needed for congestion. 07/15/21   Wallis Bamberg, PA-C    Family History Family History  Problem Relation Age of Onset   Healthy Mother    Healthy  Father     Social History Social History   Tobacco Use   Smoking status: Never   Smokeless tobacco: Never  Substance Use Topics   Alcohol use: No   Drug use: Never     Allergies   Patient has no known allergies.   Review of Systems Review of Systems Per HPI  Physical Exam Triage Vital Signs ED Triage Vitals  Enc Vitals Group     BP 11/17/21 1916 (!) 150/93     Pulse Rate 11/17/21 1916 66     Resp 11/17/21 1916 18     Temp 11/17/21 1916 98 F (36.7 C)     Temp Source 11/17/21 1916 Oral     SpO2 11/17/21 1916 98 %     Weight --      Height --      Head Circumference --      Peak Flow --      Pain Score 11/17/21 1917 10     Pain Loc --      Pain Edu? --      Excl. in GC? --    No data found.  Updated Vital Signs BP (!) 150/93 (BP Location: Right Arm)   Pulse 66   Temp 98 F (36.7 C) (Oral)   Resp 18   SpO2 98%   Visual Acuity Right Eye Distance:   Left Eye Distance:  Bilateral Distance:    Right Eye Near:   Left Eye Near:    Bilateral Near:     Physical Exam Vitals and nursing note reviewed.  Constitutional:      Appearance: Normal appearance.  HENT:     Head: Atraumatic.     Ears:     Comments: Left middle ear effusion, right TM bulging, erythematous    Nose: Nose normal.     Mouth/Throat:     Mouth: Mucous membranes are moist.  Eyes:     Extraocular Movements: Extraocular movements intact.     Conjunctiva/sclera: Conjunctivae normal.  Cardiovascular:     Rate and Rhythm: Normal rate and regular rhythm.  Pulmonary:     Effort: Pulmonary effort is normal.     Breath sounds: Normal breath sounds.  Musculoskeletal:        General: Normal range of motion.     Cervical back: Normal range of motion and neck supple.  Skin:    General: Skin is warm and dry.  Neurological:     General: No focal deficit present.     Mental Status: He is oriented to person, place, and time.  Psychiatric:        Mood and Affect: Mood normal.        Thought  Content: Thought content normal.        Judgment: Judgment normal.      UC Treatments / Results  Labs (all labs ordered are listed, but only abnormal results are displayed) Labs Reviewed - No data to display  EKG   Radiology No results found.  Procedures Procedures (including critical care time)  Medications Ordered in UC Medications - No data to display  Initial Impression / Assessment and Plan / UC Course  I have reviewed the triage vital signs and the nursing notes.  Pertinent labs & imaging results that were available during my care of the patient were reviewed by me and considered in my medical decision making (see chart for details).     Treat with Amoxil, Sudafed, Flonase nasal spray and over-the-counter pain relievers.  Return for worsening symptoms.  Final Clinical Impressions(s) / UC Diagnoses   Final diagnoses:  Acute suppurative otitis media of right ear without spontaneous rupture of tympanic membrane, recurrence not specified   Discharge Instructions   None    ED Prescriptions     Medication Sig Dispense Auth. Provider   amoxicillin (AMOXIL) 875 MG tablet Take 1 tablet (875 mg total) by mouth 2 (two) times daily. 20 tablet Particia Nearing, [place]      PDMP not reviewed this encounter.   Particia Nearing, [place] 11/17/21 1926

## 2021-11-17 NOTE — ED Triage Notes (Signed)
Pt reports right ear pain for last several hours. Pt reports history of same several months ago when dx with ear infection.
